# Patient Record
Sex: Female | Born: 1995 | Race: Asian | Hispanic: No | Marital: Single | Smoking: Never smoker
Health system: Southern US, Community
[De-identification: ages and names within clinical notes are randomized; demographics above are authoritative.]

---
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
## 2017-01-03 ENCOUNTER — Ambulatory Visit: Payer: BLUE CROSS/BLUE SHIELD | Admitting: Neurology

## 2017-03-16 ENCOUNTER — Ambulatory Visit: Payer: BLUE CROSS/BLUE SHIELD | Admitting: Neurology

## 2017-12-02 IMAGING — CT CT HEAD W/O CM
3 series · 15 of 47 positions shown, 18 images · non-contrast
Comparison: None.

CLINICAL DATA: Sudden onset of seizures.

EXAM:
CT HEAD WITHOUT CONTRAST
TECHNIQUE: Contiguous axial images were obtained from the base of the skull
through the vertex without intravenous contrast.

[Series 3: head 5.0 h30s · axial · 0.43mm/px · z∈[-107,+28]mm · 9 of 33 slices shown, 12 images]
[im 3/33  brain]
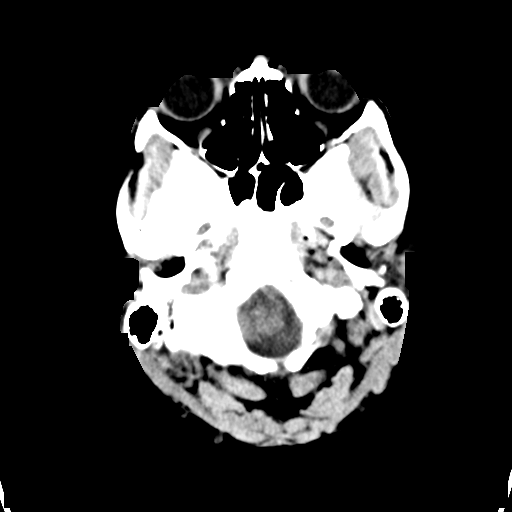
[im 3/33  bone]
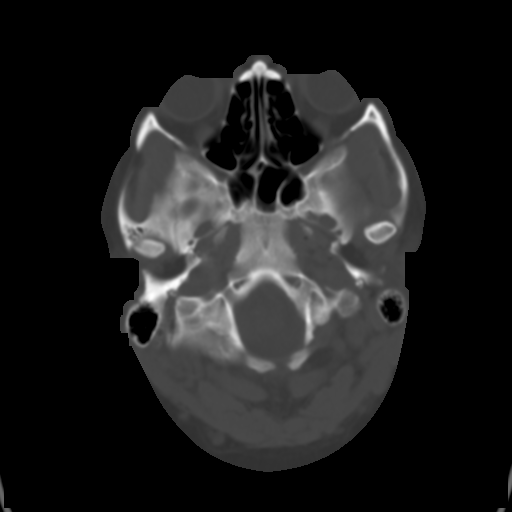
[im 6/33  brain]
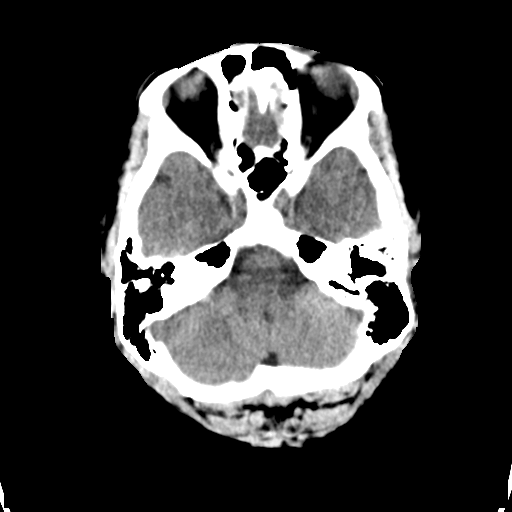
[im 9/33  brain]
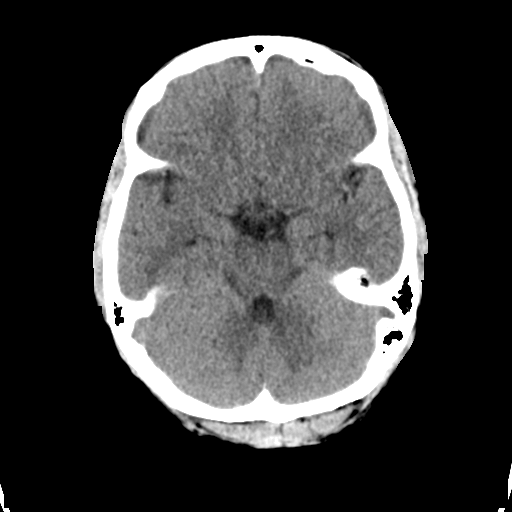
[im 13/33  brain]
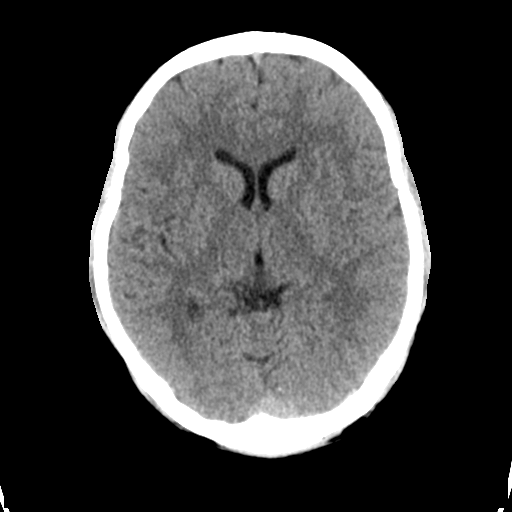
[im 17/33  brain]
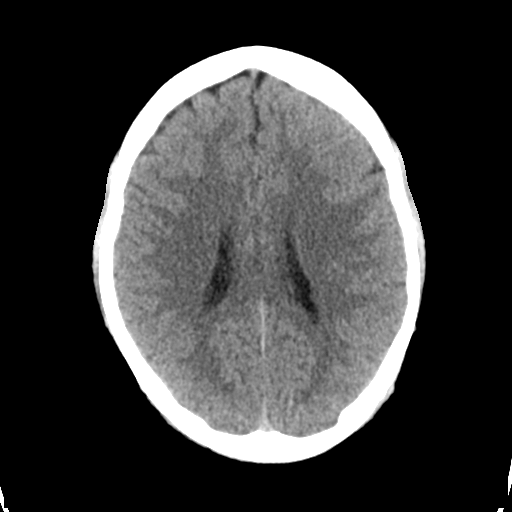
[im 17/33  bone]
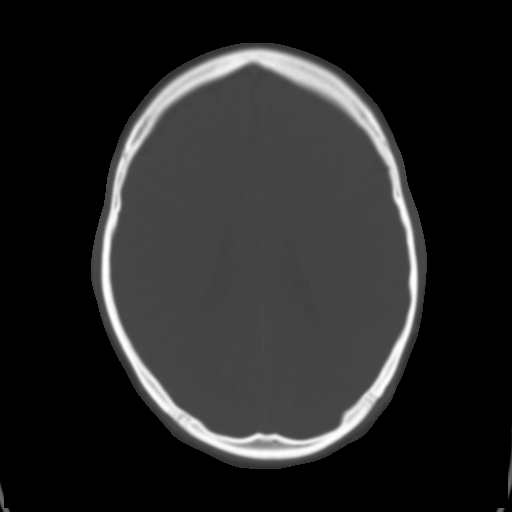
[im 20/33  brain]
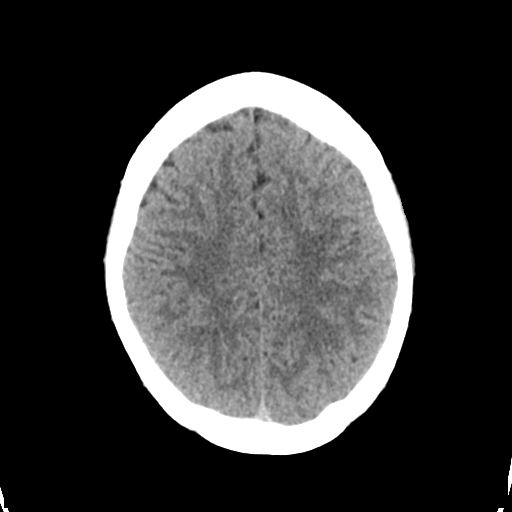
[im 24/33  brain]
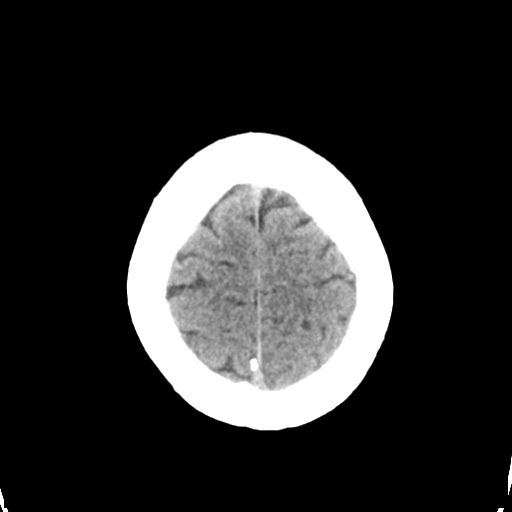
[im 27/33  brain]
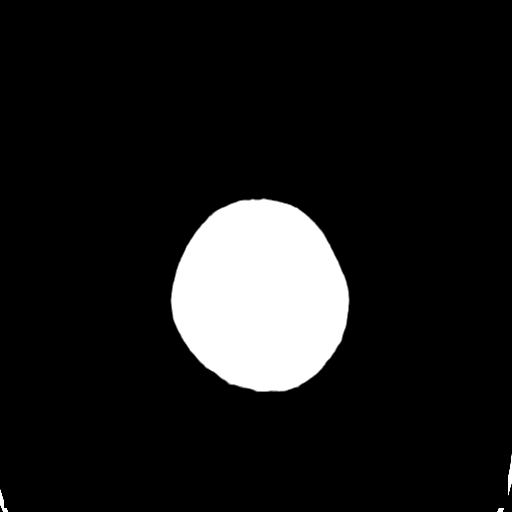
[im 30/33  brain]
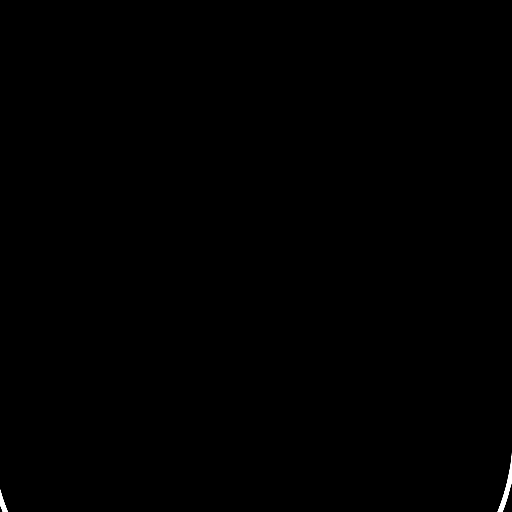
[im 30/33  bone]
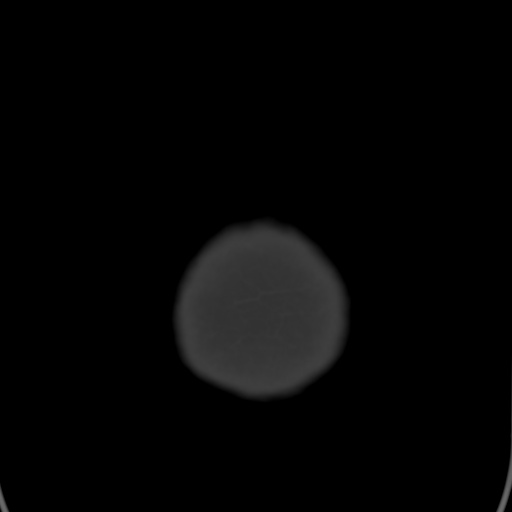

[Series 5: head 3.0 mpr cor · coronal · 0.32mm/px · 3 of 71 slices shown]
[im 24/71  brain]
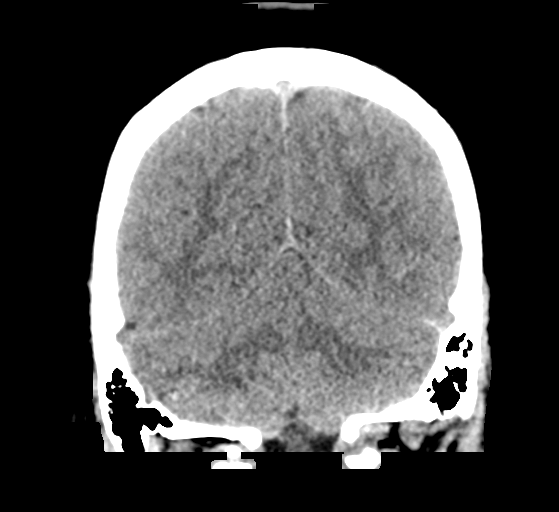
[im 32/71  brain]
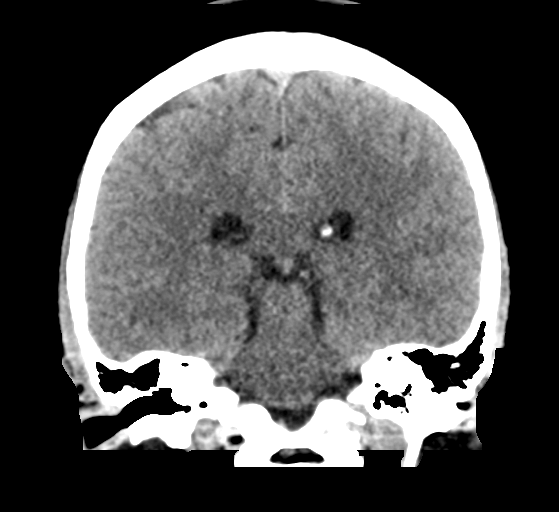
[im 39/71  brain]
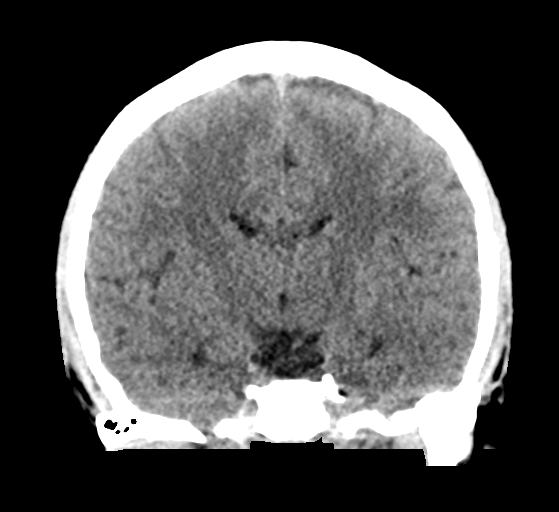

[Series 6: head 3.0 mpr sag · sagittal · 0.31mm/px · 3 of 64 slices shown]
[im 22/64  brain]
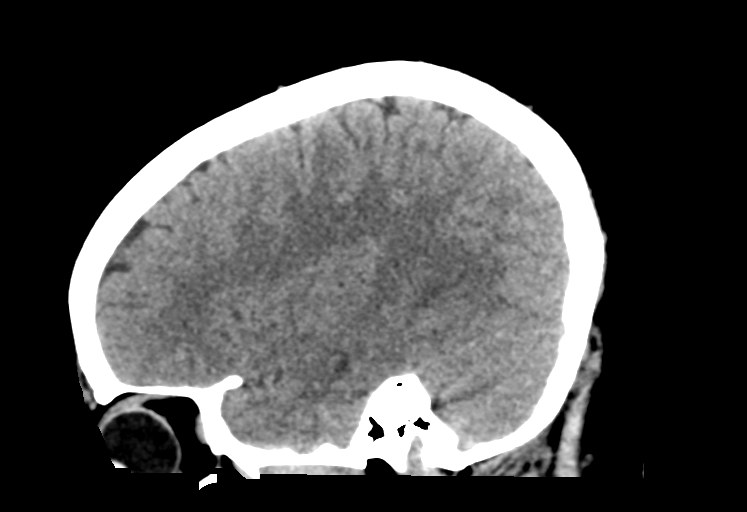
[im 32/64  brain]
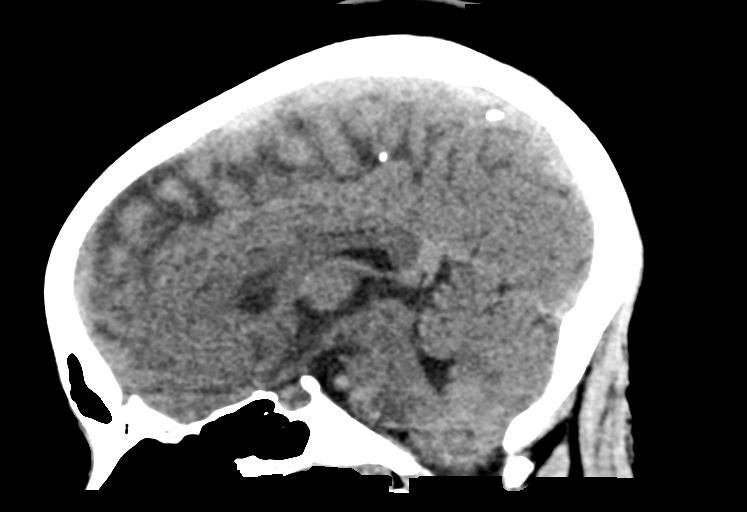
[im 43/64  brain]
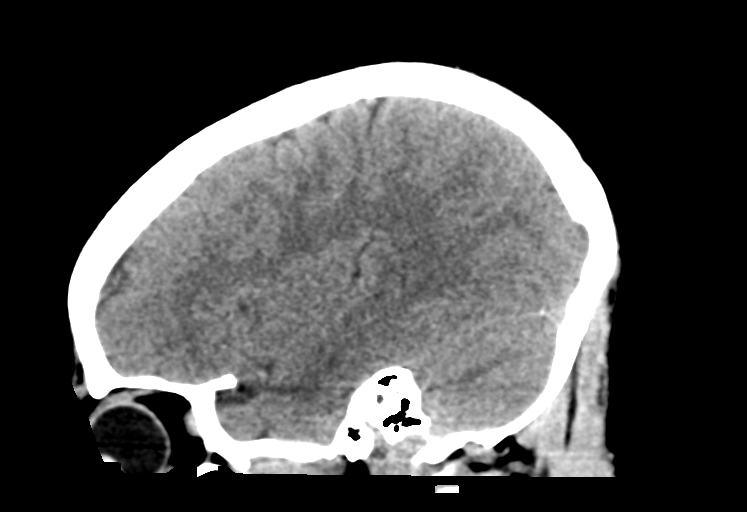

[15 of 47 positions shown; findings below may reference images not displayed]

FINDINGS: Brain: No evidence of acute infarction, hemorrhage, hydrocephalus,
extra-axial collection or mass lesion/mass effect.

Vascular: No hyperdense vessel or unexpected calcification.

Skull: Normal. Negative for fracture or focal lesion.

Sinuses/Orbits: No acute finding.

Other: None.
IMPRESSION: No acute intracranial abnormality.
